# Patient Record
Sex: Female | Born: 1970 | Race: Asian | Hispanic: No | Marital: Married | Smoking: Never smoker
Health system: Southern US, Community
[De-identification: ages and names within clinical notes are randomized; demographics above are authoritative.]

## PROBLEM LIST (undated history)

## (undated) DIAGNOSIS — R1084 Generalized abdominal pain: Secondary | ICD-10-CM

## (undated) DIAGNOSIS — G8929 Other chronic pain: Secondary | ICD-10-CM

## (undated) HISTORY — PX: COLONOSCOPY: SHX174

---

## 2005-10-16 HISTORY — PX: COLONOSCOPY: SHX174

## 2007-10-30 ENCOUNTER — Encounter: Admission: RE | Admit: 2007-10-30 | Discharge: 2007-10-30 | Payer: Self-pay | Admitting: Gastroenterology

## 2007-11-18 ENCOUNTER — Encounter: Admission: RE | Admit: 2007-11-18 | Discharge: 2007-11-18 | Payer: Self-pay | Admitting: Gastroenterology

## 2012-12-02 ENCOUNTER — Ambulatory Visit (INDEPENDENT_AMBULATORY_CARE_PROVIDER_SITE_OTHER): Payer: BC Managed Care – PPO | Admitting: Emergency Medicine

## 2012-12-02 VITALS — BP 92/65 | HR 76 | Temp 99.0°F | Resp 17 | Ht 65.5 in | Wt 122.0 lb

## 2012-12-02 DIAGNOSIS — R1013 Epigastric pain: Secondary | ICD-10-CM

## 2012-12-02 LAB — POCT CBC
Granulocyte percent: 88 %G — AB (ref 37–80)
HCT, POC: 31.4 % — AB (ref 37.7–47.9)
MCH, POC: 27.1 pg (ref 27–31.2)
MCHC: 31.8 g/dL (ref 31.8–35.4)
MCV: 85.2 fL (ref 80–97)
Platelet Count, POC: 183 10*3/uL (ref 142–424)
RDW, POC: 15.3 %
WBC: 10.6 10*3/uL — AB (ref 4.6–10.2)

## 2012-12-02 LAB — POCT URINALYSIS DIPSTICK
Bilirubin, UA: NEGATIVE
Blood, UA: NEGATIVE
Ketones, UA: 160
Spec Grav, UA: >=1.03
pH, UA: 5.5

## 2012-12-02 LAB — POCT UA - MICROSCOPIC ONLY
Bacteria, U Microscopic: NEGATIVE
Casts, Ur, LPF, POC: NEGATIVE
Crystals, Ur, HPF, POC: NEGATIVE
Mucus, UA: NEGATIVE
Yeast, UA: NEGATIVE

## 2012-12-02 NOTE — Progress Notes (Signed)
[facility] [address], [place] [address] [phone]  Date:  12/02/2012   Name:  Kathryn Nunez   DOB:  12-30-1970   MRN:  [id number]  PCP:  No primary provider on file.    Chief Complaint: Abdominal Pain   History of Present Illness:  Kathryn Nunez is a 42 y.o. very pleasant female patient who presents with the following:  Sudden onset of pain in epigastrium after eating a spicy soup for lunch.  The pain migrated into her suprapubic region around 1700 and is more intense.  She denies possibility of pregnancy.  Denies prior pain.  Is nauseated but not vomiting.  No stool change or GU or GYN symptoms.  No fever or chills.  Pain is quite acute and constant.  There is no problem list on file for this patient.   History reviewed. No pertinent past medical history.  Past Surgical History  Procedure Laterality Date  . Cesarean section      History  Substance Use Topics  . Smoking status: Never Smoker   . Smokeless tobacco: Not on file  . Alcohol Use: No    History reviewed. No pertinent family history.  No Known Allergies  Medication list has been reviewed and updated.  No current outpatient prescriptions on file prior to visit.   No current facility-administered medications on file prior to visit.    Review of Systems:  As per HPI, otherwise negative.    Physical Examination: Filed Vitals:   12/02/12 1959  BP: 92/65  Pulse: 76  Temp: 99 F (37.2 C)  Resp: 17   Filed Vitals:   12/02/12 1959  Height: 5' 5.5" (1.664 m)  Weight: 122 lb (55.339 kg)   Body mass index is 19.99 kg/(m^2). Ideal Body Weight: Weight in (lb) to have BMI = 25: 152.2  GEN: WDWN, moderate distress, restless, Non-toxic, A & O x 3 HEENT: Atraumatic, Normocephalic. Neck supple. No masses, No LAD. Ears and Nose: No external deformity. CV: RRR, No M/G/R. No JVD. No thrill. No extra heart sounds. PULM: CTA B, no wheezes, crackles, rhonchi. No retractions. No resp.  distress. No accessory muscle use. ABD: S, NT, ND, +BS. No rebound. No HSM. EXTR: No c/c/e NEURO Normal gait.  PSYCH: Normally interactive. Conversant. Not depressed or anxious appearing.  Calm demeanor.    Assessment and Plan: Abdominal pain To ER for further evaluation  Carmelina Dane, MD  Results for orders placed in visit on 12/02/12  POCT CBC      Result Value Range   WBC 10.6 (*) 4.6 - 10.2 K/uL   Lymph, poc 1.0  0.6 - 3.4   POC LYMPH PERCENT 9.2 (*) 10 - 50 %L   MID (cbc) 0.3  0 - 0.9   POC MID % 2.8  0 - 12 %M   POC Granulocyte 9.3 (*) 2 - 6.9   Granulocyte percent 88.0 (*) 37 - 80 %G   RBC 3.69 (*) 4.04 - 5.48 M/uL   Hemoglobin 10.0 (*) 12.2 - 16.2 g/dL   HCT, POC 21.3 (*) 08.6 - 47.9 %   MCV 85.2  80 - 97 fL   MCH, POC 27.1  27 - 31.2 pg   MCHC 31.8  31.8 - 35.4 g/dL   RDW, POC 57.8     Platelet Count, POC 183  142 - 424 K/uL   MPV 10.9  0 - 99.8 fL  POCT URINE PREGNANCY      Result  Value Range   Preg Test, Ur Negative    POCT URINALYSIS DIPSTICK      Result Value Range   Color, UA yellow     Clarity, UA clear     Glucose, UA neg     Bilirubin, UA neg     Ketones, UA 160     Spec Grav, UA >=1.030     Blood, UA neg     pH, UA 5.5     Protein, UA 30mg /dl     Urobilinogen, UA 0.2     Nitrite, UA neg     Leukocytes, UA Negative    POCT UA - MICROSCOPIC ONLY      Result Value Range   WBC, Ur, HPF, POC 0-3     RBC, urine, microscopic neg     Bacteria, U Microscopic neg     Mucus, UA neg     Epithelial cells, urine per micros 8-12     Crystals, Ur, HPF, POC neg     Casts, Ur, LPF, POC neg     Yeast, UA neg

## 2012-12-03 ENCOUNTER — Encounter (HOSPITAL_COMMUNITY)
Admission: EM | Disposition: A | Discharge status: Home / Self Care | Payer: Self-pay | Source: Home / Self Care | Attending: Emergency Medicine

## 2012-12-03 ENCOUNTER — Encounter (HOSPITAL_COMMUNITY): Payer: Self-pay | Admitting: Certified Registered"

## 2012-12-03 ENCOUNTER — Encounter (HOSPITAL_COMMUNITY): Payer: Self-pay | Admitting: Registered Nurse

## 2012-12-03 ENCOUNTER — Ambulatory Visit (HOSPITAL_COMMUNITY)
Admission: EM | Admit: 2012-12-03 | Discharge: 2012-12-04 | Disposition: A | Discharge status: Home / Self Care | Payer: BC Managed Care – PPO | Attending: Surgery | Admitting: Surgery

## 2012-12-03 ENCOUNTER — Encounter (HOSPITAL_COMMUNITY): Payer: Self-pay | Admitting: *Deleted

## 2012-12-03 ENCOUNTER — Emergency Department (HOSPITAL_COMMUNITY): Payer: BC Managed Care – PPO | Admitting: Registered Nurse

## 2012-12-03 ENCOUNTER — Emergency Department (HOSPITAL_COMMUNITY): Payer: BC Managed Care – PPO

## 2012-12-03 DIAGNOSIS — Z8371 Family history of colonic polyps: Secondary | ICD-10-CM

## 2012-12-03 DIAGNOSIS — Z83719 Family history of colon polyps, unspecified: Secondary | ICD-10-CM

## 2012-12-03 DIAGNOSIS — G8929 Other chronic pain: Secondary | ICD-10-CM | POA: Diagnosis present

## 2012-12-03 DIAGNOSIS — K561 Intussusception: Secondary | ICD-10-CM | POA: Insufficient documentation

## 2012-12-03 DIAGNOSIS — N854 Malposition of uterus: Secondary | ICD-10-CM | POA: Insufficient documentation

## 2012-12-03 DIAGNOSIS — K3580 Unspecified acute appendicitis: Secondary | ICD-10-CM

## 2012-12-03 DIAGNOSIS — R1084 Generalized abdominal pain: Secondary | ICD-10-CM | POA: Diagnosis present

## 2012-12-03 DIAGNOSIS — R031 Nonspecific low blood-pressure reading: Secondary | ICD-10-CM | POA: Insufficient documentation

## 2012-12-03 HISTORY — DX: Other chronic pain: G89.29

## 2012-12-03 HISTORY — PX: LAPAROSCOPIC APPENDECTOMY: SHX408

## 2012-12-03 HISTORY — DX: Generalized abdominal pain: R10.84

## 2012-12-03 LAB — COMPREHENSIVE METABOLIC PANEL
AST: 20 U/L (ref 0–37)
Albumin: 3.7 g/dL (ref 3.5–5.2)
BUN: 9 mg/dL (ref 6–23)
Chloride: 97 mEq/L (ref 96–112)
Creatinine, Ser: 0.57 mg/dL (ref 0.50–1.10)
Potassium: 3.7 mEq/L (ref 3.5–5.1)
Total Bilirubin: 0.4 mg/dL (ref 0.3–1.2)
Total Protein: 7.6 g/dL (ref 6.0–8.3)

## 2012-12-03 LAB — CBC WITH DIFFERENTIAL/PLATELET
Basophils Absolute: 0 10*3/uL (ref 0.0–0.1)
Basophils Relative: 0 % (ref 0–1)
Eosinophils Absolute: 0 10*3/uL (ref 0.0–0.7)
MCH: 27.9 pg (ref 26.0–34.0)
MCHC: 33.4 g/dL (ref 30.0–36.0)
Monocytes Absolute: 0.7 10*3/uL (ref 0.1–1.0)
Monocytes Relative: 4 % (ref 3–12)
Neutro Abs: 15.1 10*3/uL — ABNORMAL HIGH (ref 1.7–7.7)
Neutrophils Relative %: 90 % — ABNORMAL HIGH (ref 43–77)
RDW: 14.1 % (ref 11.5–15.5)

## 2012-12-03 LAB — URINALYSIS, ROUTINE W REFLEX MICROSCOPIC
Glucose, UA: NEGATIVE mg/dL
Hgb urine dipstick: NEGATIVE
Leukocytes, UA: NEGATIVE
pH: 5.5 (ref 5.0–8.0)

## 2012-12-03 SURGERY — APPENDECTOMY, LAPAROSCOPIC
Anesthesia: General | Wound class: Clean Contaminated

## 2012-12-03 SURGERY — APPENDECTOMY, LAPAROSCOPIC
Anesthesia: General

## 2012-12-03 MED ORDER — METOCLOPRAMIDE HCL 5 MG/ML IJ SOLN: 10.0000 mg | Freq: Once | INTRAMUSCULAR | Status: DC | PRN

## 2012-12-03 MED ORDER — NAPROXEN 500 MG PO TABS
500.0000 mg | ORAL_TABLET | Freq: Two times a day (BID) | ORAL | Status: DC | PRN
  Filled 2012-12-03: qty 1

## 2012-12-03 MED ORDER — ROCURONIUM BROMIDE 100 MG/10ML IV SOLN
INTRAVENOUS | Status: DC | PRN
  Administered 2012-12-03: 35 mg via INTRAVENOUS

## 2012-12-03 MED ORDER — MAGIC MOUTHWASH
15.0000 mL | Freq: Four times a day (QID) | ORAL | Status: DC | PRN
  Filled 2012-12-03: qty 15

## 2012-12-03 MED ORDER — MIDAZOLAM HCL 5 MG/5ML IJ SOLN
INTRAMUSCULAR | Status: DC | PRN
  Administered 2012-12-03: 1 mg via INTRAVENOUS

## 2012-12-03 MED ORDER — AMOXICILLIN-POT CLAVULANATE 875-125 MG PO TABS: 1.0000 | ORAL_TABLET | Freq: Two times a day (BID) | ORAL | Status: AC

## 2012-12-03 MED ORDER — OXYCODONE HCL 5 MG PO TABS: 5.0000 mg | ORAL_TABLET | Freq: Once | ORAL | Status: DC | PRN

## 2012-12-03 MED ORDER — GLYCOPYRROLATE 0.2 MG/ML IJ SOLN
INTRAMUSCULAR | Status: DC | PRN
  Administered 2012-12-03: 0.6 mg via INTRAVENOUS

## 2012-12-03 MED ORDER — LACTATED RINGERS IV BOLUS (SEPSIS): 1000.0000 mL | Freq: Three times a day (TID) | INTRAVENOUS | Status: DC | PRN

## 2012-12-03 MED ORDER — HYDROMORPHONE HCL PF 1 MG/ML IJ SOLN
0.5000 mg | INTRAMUSCULAR | Status: DC | PRN
  Administered 2012-12-03: 1 mg via INTRAVENOUS
  Filled 2012-12-03: qty 1

## 2012-12-03 MED ORDER — SUCCINYLCHOLINE CHLORIDE 20 MG/ML IJ SOLN
INTRAMUSCULAR | Status: DC | PRN
  Administered 2012-12-03: 100 mg via INTRAVENOUS

## 2012-12-03 MED ORDER — SODIUM CHLORIDE 0.9 % IV SOLN
INTRAVENOUS | Status: DC
  Administered 2012-12-03 – 2012-12-04 (×5): via INTRAVENOUS

## 2012-12-03 MED ORDER — ONDANSETRON HCL 4 MG/2ML IJ SOLN
INTRAMUSCULAR | Status: DC | PRN
  Administered 2012-12-03: 4 mg via INTRAVENOUS

## 2012-12-03 MED ORDER — IOHEXOL 300 MG/ML  SOLN
100.0000 mL | Freq: Once | INTRAMUSCULAR | Status: AC | PRN
  Administered 2012-12-03: 100 mL via INTRAVENOUS

## 2012-12-03 MED ORDER — ACETAMINOPHEN 325 MG PO TABS: 650.0000 mg | ORAL_TABLET | Freq: Four times a day (QID) | ORAL | Status: AC | PRN

## 2012-12-03 MED ORDER — ACETAMINOPHEN 650 MG RE SUPP: 650.0000 mg | Freq: Four times a day (QID) | RECTAL | Status: DC | PRN

## 2012-12-03 MED ORDER — IOHEXOL 300 MG/ML  SOLN
50.0000 mL | Freq: Once | INTRAMUSCULAR | Status: AC | PRN
  Administered 2012-12-03: 50 mL via ORAL

## 2012-12-03 MED ORDER — ONDANSETRON HCL 4 MG/2ML IJ SOLN: 4.0000 mg | Freq: Four times a day (QID) | INTRAMUSCULAR | Status: DC | PRN

## 2012-12-03 MED ORDER — LACTATED RINGERS IV SOLN
INTRAVENOUS | Status: DC | PRN
  Administered 2012-12-03 (×2): via INTRAVENOUS

## 2012-12-03 MED ORDER — ALUM & MAG HYDROXIDE-SIMETH 200-200-20 MG/5ML PO SUSP: 30.0000 mL | Freq: Four times a day (QID) | ORAL | Status: DC | PRN

## 2012-12-03 MED ORDER — PROPOFOL 10 MG/ML IV BOLUS
INTRAVENOUS | Status: DC | PRN
  Administered 2012-12-03: 150 mg via INTRAVENOUS

## 2012-12-03 MED ORDER — OXYCODONE HCL 5 MG PO TABS: 5.0000 mg | ORAL_TABLET | ORAL | Status: AC | PRN

## 2012-12-03 MED ORDER — SODIUM CHLORIDE 0.9 % IV SOLN
Freq: Once | INTRAVENOUS | Status: AC
  Administered 2012-12-03: 03:00:00 via INTRAVENOUS

## 2012-12-03 MED ORDER — DEXTROSE 5 % IV SOLN
2.0000 g | INTRAVENOUS | Status: DC | PRN
  Administered 2012-12-03: 2 g via INTRAVENOUS

## 2012-12-03 MED ORDER — LABETALOL HCL 5 MG/ML IV SOLN
INTRAVENOUS | Status: DC | PRN
  Administered 2012-12-03: 5 mg via INTRAVENOUS

## 2012-12-03 MED ORDER — OXYCODONE HCL 5 MG/5ML PO SOLN
5.0000 mg | Freq: Once | ORAL | Status: DC | PRN
  Filled 2012-12-03: qty 5

## 2012-12-03 MED ORDER — DEXTROSE 5 % IV SOLN
2.0000 g | INTRAVENOUS | Status: AC
  Filled 2012-12-03 (×2): qty 2

## 2012-12-03 MED ORDER — SODIUM CHLORIDE 0.9 % IV BOLUS (SEPSIS): 1000.0000 mL | Freq: Once | INTRAVENOUS | Status: DC

## 2012-12-03 MED ORDER — CEFOXITIN SODIUM-DEXTROSE 1-4 GM-% IV SOLR (PREMIX)
INTRAVENOUS | Status: AC
  Filled 2012-12-03: qty 100

## 2012-12-03 MED ORDER — ONDANSETRON 8 MG/NS 50 ML IVPB
8.0000 mg | Freq: Four times a day (QID) | INTRAVENOUS | Status: DC | PRN
  Filled 2012-12-03: qty 8

## 2012-12-03 MED ORDER — DIPHENHYDRAMINE HCL 50 MG/ML IJ SOLN: 12.5000 mg | Freq: Four times a day (QID) | INTRAMUSCULAR | Status: DC | PRN

## 2012-12-03 MED ORDER — BUPIVACAINE-EPINEPHRINE 0.25% -1:200000 IJ SOLN
INTRAMUSCULAR | Status: DC | PRN
  Administered 2012-12-03: 30 mL

## 2012-12-03 MED ORDER — HYDROMORPHONE HCL PF 1 MG/ML IJ SOLN: 0.2500 mg | INTRAMUSCULAR | Status: DC | PRN

## 2012-12-03 MED ORDER — OXYCODONE HCL 5 MG PO TABS
5.0000 mg | ORAL_TABLET | ORAL | Status: DC | PRN
Start: 2012-12-03 — End: 2012-12-04
  Administered 2012-12-03 – 2012-12-04 (×3): 5 mg via ORAL
  Filled 2012-12-03 (×3): qty 1

## 2012-12-03 MED ORDER — HEPARIN SODIUM (PORCINE) 5000 UNIT/ML IJ SOLN
5000.0000 [IU] | Freq: Three times a day (TID) | INTRAMUSCULAR | Status: DC
  Administered 2012-12-04: 5000 [IU] via SUBCUTANEOUS
  Filled 2012-12-03 (×4): qty 1

## 2012-12-03 MED ORDER — LACTATED RINGERS IV BOLUS (SEPSIS)
800.0000 mL | Freq: Once | INTRAVENOUS | Status: AC
  Administered 2012-12-03: 800 mL via INTRAVENOUS

## 2012-12-03 MED ORDER — BUPIVACAINE-EPINEPHRINE PF 0.25-1:200000 % IJ SOLN
INTRAMUSCULAR | Status: AC
  Filled 2012-12-03: qty 30

## 2012-12-03 MED ORDER — DEXAMETHASONE SODIUM PHOSPHATE 10 MG/ML IJ SOLN
INTRAMUSCULAR | Status: DC | PRN
  Administered 2012-12-03: 10 mg via INTRAVENOUS

## 2012-12-03 MED ORDER — ACETAMINOPHEN 325 MG PO TABS: 325.0000 mg | ORAL_TABLET | Freq: Four times a day (QID) | ORAL | Status: DC | PRN

## 2012-12-03 MED ORDER — ONDANSETRON HCL 4 MG/2ML IJ SOLN
4.0000 mg | Freq: Four times a day (QID) | INTRAMUSCULAR | Status: DC | PRN
  Administered 2012-12-03: 4 mg via INTRAVENOUS
  Filled 2012-12-03 (×2): qty 2

## 2012-12-03 MED ORDER — LIP MEDEX EX OINT
1.0000 "application " | TOPICAL_OINTMENT | Freq: Two times a day (BID) | CUTANEOUS | Status: DC
  Administered 2012-12-03 – 2012-12-04 (×2): 1 via TOPICAL
  Filled 2012-12-03 (×2): qty 7

## 2012-12-03 MED ORDER — NEOSTIGMINE METHYLSULFATE 1 MG/ML IJ SOLN
INTRAMUSCULAR | Status: DC | PRN
  Administered 2012-12-03: 4 mg via INTRAVENOUS

## 2012-12-03 MED ORDER — DEXTROSE 5 % IV SOLN
2.0000 g | Freq: Four times a day (QID) | INTRAVENOUS | Status: DC
  Administered 2012-12-03 – 2012-12-04 (×5): 2 g via INTRAVENOUS
  Filled 2012-12-03 (×7): qty 2

## 2012-12-03 MED ORDER — FENTANYL CITRATE 0.05 MG/ML IJ SOLN
INTRAMUSCULAR | Status: DC | PRN
  Administered 2012-12-03: 100 ug via INTRAVENOUS
  Administered 2012-12-03: 50 ug via INTRAVENOUS
  Administered 2012-12-03 (×2): 100 ug via INTRAVENOUS

## 2012-12-03 MED ORDER — BISACODYL 10 MG RE SUPP: 10.0000 mg | Freq: Two times a day (BID) | RECTAL | Status: DC | PRN

## 2012-12-03 MED ORDER — ONDANSETRON HCL 4 MG/2ML IJ SOLN
4.0000 mg | Freq: Once | INTRAMUSCULAR | Status: AC
  Administered 2012-12-03: 4 mg via INTRAVENOUS
  Filled 2012-12-03: qty 2

## 2012-12-03 MED ORDER — KETOROLAC TROMETHAMINE 30 MG/ML IJ SOLN
INTRAMUSCULAR | Status: DC | PRN
  Administered 2012-12-03: 30 mg via INTRAVENOUS

## 2012-12-03 MED ORDER — SODIUM CHLORIDE 0.9 % IV BOLUS (SEPSIS)
500.0000 mL | Freq: Once | INTRAVENOUS | Status: AC
  Administered 2012-12-03: 500 mL via INTRAVENOUS

## 2012-12-03 MED ORDER — METOPROLOL TARTRATE 1 MG/ML IV SOLN
5.0000 mg | Freq: Four times a day (QID) | INTRAVENOUS | Status: DC | PRN
  Filled 2012-12-03: qty 5

## 2012-12-03 MED ORDER — PROMETHAZINE HCL 25 MG/ML IJ SOLN
12.5000 mg | Freq: Four times a day (QID) | INTRAMUSCULAR | Status: DC | PRN
  Administered 2012-12-03: 25 mg via INTRAVENOUS
  Filled 2012-12-03 (×2): qty 1

## 2012-12-03 MED ORDER — FENTANYL CITRATE 0.05 MG/ML IJ SOLN
50.0000 ug | Freq: Once | INTRAMUSCULAR | Status: AC
  Administered 2012-12-03: 50 ug via INTRAVENOUS
  Filled 2012-12-03: qty 2

## 2012-12-03 MED ORDER — ACETAMINOPHEN 10 MG/ML IV SOLN
INTRAVENOUS | Status: DC | PRN
  Administered 2012-12-03: 1000 mg via INTRAVENOUS

## 2012-12-03 MED ORDER — MAGNESIUM HYDROXIDE 400 MG/5ML PO SUSP: 30.0000 mL | Freq: Two times a day (BID) | ORAL | Status: DC | PRN

## 2012-12-03 MED ORDER — ACETAMINOPHEN 10 MG/ML IV SOLN
INTRAVENOUS | Status: AC
  Filled 2012-12-03: qty 100

## 2012-12-03 SURGICAL SUPPLY — 38 items
APPLIER CLIP 5 13 M/L LIGAMAX5 (MISCELLANEOUS)
APPLIER CLIP ROT 10 11.4 M/L (STAPLE)
CANISTER SUCTION 2500CC (MISCELLANEOUS) ×2 IMPLANT
CLIP APPLIE 5 13 M/L LIGAMAX5 (MISCELLANEOUS) IMPLANT
CLIP APPLIE ROT 10 11.4 M/L (STAPLE) IMPLANT
CLOTH BEACON ORANGE TIMEOUT ST (SAFETY) ×2 IMPLANT
CUTTER FLEX LINEAR 45M (STAPLE) IMPLANT
DECANTER SPIKE VIAL GLASS SM (MISCELLANEOUS) ×2 IMPLANT
DRAPE LAPAROSCOPIC ABDOMINAL (DRAPES) ×2 IMPLANT
DRAPE UTILITY XL STRL (DRAPES) ×2 IMPLANT
DRSG TEGADERM 2-3/8X2-3/4 SM (GAUZE/BANDAGES/DRESSINGS) ×4 IMPLANT
DRSG TEGADERM 4X4.75 (GAUZE/BANDAGES/DRESSINGS) ×2 IMPLANT
ELECT REM PT RETURN 9FT ADLT (ELECTROSURGICAL) ×2
ELECTRODE REM PT RTRN 9FT ADLT (ELECTROSURGICAL) ×1 IMPLANT
ENDOLOOP SUT PDS II  0 18 (SUTURE)
ENDOLOOP SUT PDS II 0 18 (SUTURE) IMPLANT
GLOVE BIOGEL PI IND STRL 7.0 (GLOVE) ×1 IMPLANT
GLOVE BIOGEL PI INDICATOR 7.0 (GLOVE) ×1
GLOVE ECLIPSE 8.0 STRL XLNG CF (GLOVE) ×2 IMPLANT
GLOVE INDICATOR 8.0 STRL GRN (GLOVE) ×4 IMPLANT
GOWN STRL NON-REIN LRG LVL3 (GOWN DISPOSABLE) ×2 IMPLANT
GOWN STRL REIN XL XLG (GOWN DISPOSABLE) ×4 IMPLANT
KIT BASIN OR (CUSTOM PROCEDURE TRAY) ×2 IMPLANT
NS IRRIG 1000ML POUR BTL (IV SOLUTION) ×2 IMPLANT
PENCIL BUTTON HOLSTER BLD 10FT (ELECTRODE) ×2 IMPLANT
POUCH SPECIMEN RETRIEVAL 10MM (ENDOMECHANICALS) IMPLANT
RELOAD 45 VASCULAR/THIN (ENDOMECHANICALS) IMPLANT
RELOAD STAPLE TA45 3.5 REG BLU (ENDOMECHANICALS) IMPLANT
SET IRRIG TUBING LAPAROSCOPIC (IRRIGATION / IRRIGATOR) ×2 IMPLANT
SOLUTION ANTI FOG 6CC (MISCELLANEOUS) ×2 IMPLANT
SUT MNCRL AB 4-0 PS2 18 (SUTURE) ×2 IMPLANT
TOWEL OR 17X26 10 PK STRL BLUE (TOWEL DISPOSABLE) ×2 IMPLANT
TRAY FOLEY CATH 14FRSI W/METER (CATHETERS) ×2 IMPLANT
TRAY LAP CHOLE (CUSTOM PROCEDURE TRAY) ×2 IMPLANT
TROCAR BLADELESS OPT 5 100 (ENDOMECHANICALS) ×2 IMPLANT
TROCAR XCEL 12X100 BLDLESS (ENDOMECHANICALS) ×2 IMPLANT
TROCAR XCEL BLADELESS 5X75MML (TROCAR) ×2 IMPLANT
TUBING INSUFFLATION 10FT LAP (TUBING) ×2 IMPLANT

## 2012-12-03 NOTE — Anesthesia Preprocedure Evaluation (Signed)
Anesthesia Evaluation  Patient identified by MRN, date of birth, ID band Patient awake    Reviewed: Allergy & Precautions, H&P , NPO status , Patient's Chart, lab work & pertinent test results, reviewed documented beta blocker date and time   Airway Mallampati: II TM Distance: >3 FB Neck ROM: full    Dental   Pulmonary neg pulmonary ROS,  breath sounds clear to auscultation        Cardiovascular negative cardio ROS  Rhythm:regular     Neuro/Psych negative neurological ROS  negative psych ROS   GI/Hepatic negative GI ROS, Neg liver ROS,   Endo/Other  negative endocrine ROS  Renal/GU negative Renal ROS  negative genitourinary   Musculoskeletal   Abdominal   Peds  Hematology negative hematology ROS (+)   Anesthesia Other Findings See surgeon's H&P   Reproductive/Obstetrics negative OB ROS                           Anesthesia Physical Anesthesia Plan  ASA: I and emergent  Anesthesia Plan: General   Post-op Pain Management:    Induction: Intravenous, Rapid sequence and Cricoid pressure planned  Airway Management Planned: Oral ETT  Additional Equipment:   Intra-op Plan:   Post-operative Plan: Extubation in OR  Informed Consent: I have reviewed the patients History and Physical, chart, labs and discussed the procedure including the risks, benefits and alternatives for the proposed anesthesia with the patient or authorized representative who has indicated his/her understanding and acceptance.   Dental Advisory Given  Plan Discussed with: CRNA and Surgeon  Anesthesia Plan Comments:         Anesthesia Quick Evaluation  

## 2012-12-03 NOTE — Progress Notes (Signed)
Dr. Gelene Mink in- made aware of patient's blood pressures- O.K. To go to floor

## 2012-12-03 NOTE — ED Provider Notes (Signed)
History     CSN: [id number]  Arrival date & time 12/03/12  0236   First MD Initiated Contact with Patient 12/03/12 0247      Chief Complaint  Patient presents with  . Abdominal Pain    (Consider location/radiation/quality/duration/timing/severity/associated sxs/prior treatment) HPI 42 year old female presents to emergency department complaining of diffuse abdominal pain. Patient was seen in urgent care earlier today after onset of upper abdominal pain described as a burning sensation after having soup at work.She had lab work done there, andreports her white blood cell count was slightly elevated. Patient denies history of diverticulitis. She has had colonoscopy due to left-sided abdominal pain in the past. She reports she had polyps. She is nauseated but no vomiting. Pain has moved from her epigastrium down throughout her abdomen. She denies any urinary symptoms. Last menstrual period was earlier this month. She's had a C-section, but no other abdominal surgeries. No fever, diarrhea or vomiting. No recent travel, no unusual foods, no sick contacts.  Pain not improving with time.  History reviewed. No pertinent past medical history.  Past Surgical History  Procedure Laterality Date  . Cesarean section      History reviewed. No pertinent family history.  History  Substance Use Topics  . Smoking status: Never Smoker   . Smokeless tobacco: Not on file  . Alcohol Use: No    OB History   Grav Para Term Preterm Abortions TAB SAB Ect Mult Living                  Review of Systems  All other systems reviewed and are negative.    Allergies  Review of patient's allergies indicates no known allergies.  Home Medications   Current Outpatient Rx  Name  Route  Sig  Dispense  Refill  . Green Tea, Camillia sinensis, (GREEN TEA PO)   Oral   Take 2 capsules by mouth.           BP 115/78  Pulse 87  Temp(Src) 98 F (36.7 C) (Oral)  Resp 20  SpO2 99%  LMP  11/21/2012  Physical Exam  Nursing note reviewed. Constitutional: She is oriented to person, place, and time. She appears well-developed and well-nourished.  HENT:  Head: Normocephalic and atraumatic.  Nose: Nose normal.  Mouth/Throat: Oropharynx is clear and moist.  Eyes: Conjunctivae and EOM are normal. Pupils are equal, round, and reactive to light.  Neck: Normal range of motion. Neck supple. No JVD present. No tracheal deviation present. No thyromegaly present.  Cardiovascular: Normal rate, regular rhythm, normal heart sounds and intact distal pulses.  Exam reveals no gallop and no friction rub.   No murmur heard. Pulmonary/Chest: Effort normal and breath sounds normal. No stridor. No respiratory distress. She has no wheezes. She has no rales. She exhibits no tenderness.  Abdominal: Soft. Bowel sounds are normal. She exhibits no distension and no mass. There is tenderness (diffuse tenderness, slightly worse in LLQ). There is no rebound and no guarding.  Musculoskeletal: Normal range of motion. She exhibits no edema and no tenderness.  Lymphadenopathy:    She has no cervical adenopathy.  Neurological: She is alert and oriented to person, place, and time. No cranial nerve deficit. She exhibits normal muscle tone. Coordination normal.  Skin: Skin is warm and dry. No rash noted. No erythema. No pallor.  Psychiatric: She has a normal mood and affect. Her behavior is normal. Judgment and thought content normal.    ED Course  Procedures (including critical care  time)  Labs Reviewed  CBC WITH DIFFERENTIAL - Abnormal; Notable for the following:    WBC 16.9 (*)    HCT 35.9 (*)    Neutrophils Relative 90 (*)    Neutro Abs 15.1 (*)    Lymphocytes Relative 6 (*)    All other components within normal limits  COMPREHENSIVE METABOLIC PANEL - Abnormal; Notable for the following:    Sodium 132 (*)    Glucose, Bld 111 (*)    All other components within normal limits  URINALYSIS, ROUTINE W  REFLEX MICROSCOPIC - Abnormal; Notable for the following:    Ketones, ur >80 (*)    All other components within normal limits   Ct Abdomen Pelvis W Contrast  12/03/2012  *RADIOLOGY REPORT*  Clinical Data: Epigastric pain  CT ABDOMEN AND PELVIS WITH CONTRAST  Technique:  Multidetector CT imaging of the abdomen and pelvis was performed following the standard protocol during bolus administration of intravenous contrast.  Contrast: OMNIPAQUE IOHEXOL 300 MG/ML  SOLN  Comparison: 10/30/2007  Findings: Lung bases are clear.  Unremarkable liver, biliary system, spleen, pancreas, adrenal glands.  Symmetric renal enhancement.  No hydronephrosis or hydroureter.  No CT evidence for colitis. Limited colonic evaluation due to decompressed state.  The appendix is distended up to 1 cm.  An appendicolith is present.  Mild periappendiceal fat stranding about the tip.  An intussusception of small bowel is noted in the left hemiabdomen (series 2 image 35), likely transient/incidental. No bowel obstruction.  No free intraperitoneal air.  No lymphadenopathy.  Normal caliber aorta and branch vessels.  Bladder within normal limits.  Trace free fluid within the pelvis.  Retroverted uterus with suggestion of intramural fibroids.  No acute osseous finding.  IMPRESSION: Distended appendix up to 1 cm with mild periappendiceal fat stranding.  There is an appendicolith at the base.  These findings are suspicious for acute appendicitis.   Original Report Authenticated By: Jearld Lesch, M.D.      1. Appendicitis, acute       MDM  42 year old female with worsening abdominal pain over the last 12-14 hours. Patient had CBC done at urgent care which showed white count 10.6, currently 16.9 with left shift. Concern for possible diverticulitis. We'll get CT abdomen pelvis.        Olivia Mackie, MD 12/03/12 [phone]

## 2012-12-03 NOTE — Progress Notes (Signed)
Dr. Gelene Mink made aware of patient's blood pressures- orders given- patient to receive a total of 800 cc LR IVB in PACU

## 2012-12-03 NOTE — Anesthesia Postprocedure Evaluation (Signed)
Anesthesia Post Note  Patient: Kathryn Nunez  Procedure(s) Performed: Procedure(s) (LRB): APPENDECTOMY LAPAROSCOPIC (N/A)  Anesthesia type: General  Patient location: PACU  Post pain: Pain level controlled  Post assessment: Patient's Cardiovascular Status Stable  Last Vitals:  Filed Vitals:   12/03/12 0815  BP: 97/47  Pulse: 58  Temp:   Resp: 16    Post vital signs: Reviewed and stable  Level of consciousness: alert  Complications: No apparent anesthesia complications

## 2012-12-03 NOTE — ED Notes (Signed)
Pt in from urgent care c/o generalized abd pain, also nausea but denies vomiting, symptoms started earlier today

## 2012-12-03 NOTE — Progress Notes (Signed)
Dr. Gelene Mink in- made aware of patient's blood pressures

## 2012-12-03 NOTE — Transfer of Care (Signed)
Immediate Anesthesia Transfer of Care Note  Patient: Kathryn Nunez  Procedure(s) Performed: Procedure(s): APPENDECTOMY LAPAROSCOPIC (N/A)  Patient Location: PACU  Anesthesia Type:General  Level of Consciousness: awake, alert , oriented and patient cooperative  Airway & Oxygen Therapy: Patient Spontanous Breathing and Patient connected to face mask oxygen  Post-op Assessment: Report given to PACU RN, Post -op Vital signs reviewed and stable and Patient moving all extremities X 4  Post vital signs: stable  Complications: No apparent anesthesia complications

## 2012-12-03 NOTE — Op Note (Addendum)
7:54 AM  PATIENT:  Kathryn Nunez  42 y.o. female  Patient has no care team.  PRE-OPERATIVE DIAGNOSIS:  appendicitis  POST-OPERATIVE DIAGNOSIS: Acute suppurative appendicitis  PROCEDURES:   Diagnostic laparoscopy Laparoscopic appendectomy  SURGEON:  Surgeon(s): Ardeth Sportsman, MD  ASSISTANT: RN   ANESTHESIA:   local and general  EBL:  Total I/O In: 1000 [I.V.:1000] Out: -   Delay start of Pharmacological VTE agent (>24hrs) due to surgical blood loss or risk of bleeding:  no  DRAINS: none   SPECIMEN:  Source of Specimen:  Appendix  DISPOSITION OF SPECIMEN:  PATHOLOGY  COUNTS:  YES  PLAN OF CARE: Admit for overnight observation  PATIENT DISPOSITION:  PACU - hemodynamically stable.   INDICATIONS: Patient with concerning symptoms & work up suspicious for appendicitis.  Surgery was recommended:  The anatomy & physiology of the digestive tract was discussed.  The pathophysiology of appendicitis was discussed.  Natural history risks without surgery was discussed.   I feel the risks of no intervention will lead to serious problems that outweigh the operative risks; therefore, I recommended diagnostic laparoscopy with removal of appendix to remove the pathology.  Laparoscopic & open techniques were discussed.   I noted a good likelihood this will help address the problem.    Risks such as bleeding, infection, abscess, leak, reoperation, possible ostomy, hernia, heart attack, death, and other risks were discussed.  Goals of post-operative recovery were discussed as well.  We will work to minimize complications.  Questions were answered.  The patient expresses understanding & wishes to proceed with surgery.  OR FINDINGS: Inflamed anterior appendix with phlegmon.  Mild separation.  Mild focal peritonitis.  No evidence of intra-abdominal adhesions.  No Meckel's diverticulum.  Ovary uterus normal.:  Normal.  Mild adhesions to gallbladder gallbladder otherwise normal.  Liver  normal.  DESCRIPTION:   The patient was identified & brought into the operating room. The patient was positioned supine with arms tucked. SCDs were active during the entire case. The patient underwent general anesthesia without any difficulty.  The abdomen was prepped and draped in a sterile fashion. A Surgical Timeout confirmed our plan.  I made a transverse incision through the superior umbilical fold.  I made a small transverse nick through the infraumbilical fascia and confirmed peritoneal entry.  I placed a 5mm port.  We induced carbon dioxide insufflation.  Camera inspection revealed no injury.  I placed additional ports under direct laparoscopic visualization.  I mobilized the terminal ileum to proximal ascending colon in a lateral to medial fashion.  I took care to avoid injuring any retroperitoneal structures.  I freed the appendix off its attachments to the ascending colon and cecal mesentery.  I elevated the appendix. I skeletonized the mesoappendix. I was able to free off the base of the appendix which was still viable.  I stapled the appendix off the cecum using a laparoscopic stapler. I took a healthy cuff of viable cecum. I ligated the mesoappendix and assured hemostasis in the mesentery.  I placed the appendix inside an EndoCatch bag and removed out the 12 mm port.  I did copious irrigation. Hemostasis was good in the mesoappendix, colon mesentery, and retroperitoneum. Staple line was intact on the cecum with no bleeding. I washed out the pelvis, retrohepatic space and right paracolic gutter. I washed out the left side as well.  Hemostasis is good. There was no perforation or injury.  Because the area cleaned up well after irrigation, I did not place a  drain.  Because of her history of intermittent abdominal pain, I decided to perform a more thorough diagnostic laparoscopy.  I ran the small bowel from the the cecal valve to the ligament of Treitz.  No Meckel's was found.  No intraloop  adhesions.  The colon appeared normal from the cecum to the peritoneal reflection.  Adnexa seem normal.  Uterus had some mild fibroid change to it but nothing severely abnormal.  A few small Adhesions to the gallbladder the right upper quadrant to greater omentum but no evidence of cholecystitis.  Liver looked normal.  Stomach and spleen with normal.  Omentum seemed normal.  I saw no hernias.  This was consistent with a normal diagnostic laparoscopy aside from the appendicitis already stated.  I aspirated the carbon dioxide. I removed the ports. I closed the 12 mm fascia site using a 0 Vicryl stitch. I closed skin using 4-0 monocryl stitch.  Patient was extubated and sent to the recovery room.  I am trying to locate the patient's family to discuss the operative findings. I suspect the patient is going used in the hospital at least this AM and will need antibiotics for 3 days.

## 2012-12-03 NOTE — H&P (Signed)
Kathryn Nunez  11/29/1970 [id number]   This patient is a 42 y.o.female who presents today for surgical evaluation at the request of Dr Norlene Campbell, [facility] ED.   Reason for evaluation: Acute appendicitis  Young female originally from [place] who has some family history of colon polyps.  Underwent colonoscopy when she was younger while she lived in [place].  Initial one showed polyps in 2007.  Came to [place].  She tells me she had a colonoscopy done a few years ago by Dr. Bosie Clos.  That colonoscopy was normal.  I do not have access to records at this time.  Sounds like she has had a history of intermittent chronic abdominal pain that is central.  CT enterography and colonoscopy in 2009 negative.    More recently he started having new pain in her upper abdomen after having soup for lunch.  Burning abdominal pain was mainly central.  However the pain is now lower.  Some nausea but no vomiting yet.  Worse after eating.  She went to urgent care clinic.  Elevated white count noted.  Sent in the emergency room for CAT scan.  CT scan concerning for appendicitis.  Dr. Norlene Campbell requested surgical consultation.  No personal nor family history of GI/colon cancer, inflammatory bowel disease, irritable bowel syndrome, allergy such as Celiac Sprue, dietary/dairy problems, colitis, ulcers nor gastritis.  No recent sick contacts/gastroenteritis.  No travel outside the country.  No changes in diet.    History reviewed. No pertinent past medical history.  Past Surgical History  Procedure Laterality Date  . Cesarean section      History   Social History  . Marital Status: Married    Spouse Name: N/A    Number of Children: N/A  . Years of Education: N/A   Occupational History  . Not on file.   Social History Main Topics  . Smoking status: Never Smoker   . Smokeless tobacco: Not on file  . Alcohol Use: No  . Drug Use: No  . Sexually Active: Yes    Birth Control/ Protection: None   Other Topics Concern  . Not  on file   Social History Narrative  . No narrative on file    History reviewed. No pertinent family history.  Current Facility-Administered Medications  Medication Dose Route Frequency Provider Last Rate Last Dose  . 0.9 %  sodium chloride infusion   Intravenous Continuous Ardeth Sportsman, MD      . acetaminophen (TYLENOL) suppository 650 mg  650 mg Rectal Q6H PRN Ardeth Sportsman, MD      . acetaminophen (TYLENOL) tablet 325-650 mg  325-650 mg Oral Q6H PRN Ardeth Sportsman, MD      . alum & mag hydroxide-simeth (MAALOX/MYLANTA) 200-200-20 MG/5ML suspension 30 mL  30 mL Oral Q6H PRN Ardeth Sportsman, MD      . bisacodyl (DULCOLAX) suppository 10 mg  10 mg Rectal Q12H PRN Ardeth Sportsman, MD      . cefOXitin (MEFOXIN) 2 g in dextrose 5 % 50 mL IVPB  2 g Intravenous On Call to OR Ardeth Sportsman, MD      . diphenhydrAMINE (BENADRYL) injection 12.5-25 mg  12.5-25 mg Intravenous Q6H PRN Ardeth Sportsman, MD      . HYDROmorphone (DILAUDID) injection 0.5-2 mg  0.5-2 mg Intravenous Q30 min PRN Ardeth Sportsman, MD      . lactated ringers bolus 1,000 mL  1,000 mL Intravenous Q8H PRN Ardeth Sportsman, MD      .  lip balm (CARMEX) ointment 1 application  1 application Topical BID Ardeth Sportsman, MD      . magic mouthwash  15 mL Oral QID PRN Ardeth Sportsman, MD      . magnesium hydroxide (MILK OF MAGNESIA) suspension 30 mL  30 mL Oral Q12H PRN Ardeth Sportsman, MD      . metoprolol (LOPRESSOR) injection 5 mg  5 mg Intravenous Q6H PRN Ardeth Sportsman, MD      . naproxen (NAPROSYN) tablet 500 mg  500 mg Oral Q12H PRN Ardeth Sportsman, MD      . ondansetron [facility]) injection 4-8 mg  4-8 mg Intravenous Q6H PRN Ardeth Sportsman, MD      . oxyCODONE (Oxy IR/ROXICODONE) immediate release tablet 5-10 mg  5-10 mg Oral Q4H PRN Ardeth Sportsman, MD      . promethazine (PHENERGAN) injection 12.5-25 mg  12.5-25 mg Intravenous Q6H PRN Ardeth Sportsman, MD      . sodium chloride 0.9 % bolus 1,000 mL  1,000 mL Intravenous  Once Olivia Mackie, MD 1,000 mL/hr at 12/03/12 0409 1,000 mL at 12/03/12 0409   Current Outpatient Prescriptions  Medication Sig Dispense Refill  . Green Tea, Camillia sinensis, (GREEN TEA PO) Take 2 capsules by mouth.         No Known Allergies  ROS: Constitutional:  No fevers, chills, sweats.  Weight stable Eyes:  No vision changes, No discharge HENT:  No sore throats, nasal drainage Lymph: No neck swelling, No bruising easily Pulmonary:  No cough, productive sputum CV: No orthopnea, PND  Patient walks 60 minutes for about 1-2 miles without difficulty.  No exertional chest/neck/shoulder/arm pain. GI:  No personal nor family history of GI/colon cancer but +polyps.  No inflammatory bowel disease, irritable bowel syndrome, allergy such as Celiac Sprue, dietary/dairy problems, colitis, ulcers nor gastritis.  No recent sick contacts/gastroenteritis.  No travel outside the country.  No changes in diet. Renal: No UTIs, No hematuria Genital:  No drainage, bleeding, masses Musculoskeletal: No severe joint pain.  Good ROM major joints Skin:  No sores or lesions.  No rashes Heme/Lymph:  No easy bleeding.  No swollen lymph nodes Neuro: No focal weakness/numbness.  No seizures Psych: No suicidal ideation.  No hallucinations  BP 115/78  Pulse 87  Temp(Src) 98 F (36.7 C) (Oral)  Resp 20  SpO2 99%  LMP 11/21/2012  Physical Exam: General: Pt awake/alert/oriented x4 in no major acute distress Eyes: PERRL, normal EOM. Sclera nonicteric Neuro: CN II-XII intact w/o focal sensory/motor deficits. Lymph: No head/neck/groin lymphadenopathy Psych:  No delerium/psychosis/paranoia HENT: Normocephalic, Mucus membranes moist.  No thrush Neck: Supple, No tracheal deviation Chest: No pain.  Good respiratory excursion. CV:  Pulses intact.  Regular rhythm Abdomen: Soft, Nondistended.  +TTP RLQ at McBurney's point.  Worse w cough, carride, bedshake.  +Rosvig sign.  No incarcerated hernias.  Pfannenstiel  incision well healed. Ext:  SCDs BLE.  No significant edema.  No cyanosis Skin: No petechiae / purpurea.  No major sores Musculoskeletal: No severe joint pain.  Good ROM major joints   Results:   Labs: Results for orders placed during the hospital encounter of 12/03/12 (from the past 48 hour(s))  CBC WITH DIFFERENTIAL     Status: Abnormal   Collection Time    12/03/12  2:49 AM      Result Value Range   WBC 16.9 (*) 4.0 - 10.5 K/uL   RBC 4.30  3.87 - 5.11  MIL/uL   Hemoglobin 12.0  12.0 - 15.0 g/dL   HCT 16.1 (*) 09.6 - 04.5 %   MCV 83.5  78.0 - 100.0 fL   MCH 27.9  26.0 - 34.0 pg   MCHC 33.4  30.0 - 36.0 g/dL   RDW 40.9  81.1 - 91.4 %   Platelets 298  150 - 400 K/uL   Neutrophils Relative 90 (*) 43 - 77 %   Neutro Abs 15.1 (*) 1.7 - 7.7 K/uL   Lymphocytes Relative 6 (*) 12 - 46 %   Lymphs Abs 1.0  0.7 - 4.0 K/uL   Monocytes Relative 4  3 - 12 %   Monocytes Absolute 0.7  0.1 - 1.0 K/uL   Eosinophils Relative 0  0 - 5 %   Eosinophils Absolute 0.0  0.0 - 0.7 K/uL   Basophils Relative 0  0 - 1 %   Basophils Absolute 0.0  0.0 - 0.1 K/uL  COMPREHENSIVE METABOLIC PANEL     Status: Abnormal   Collection Time    12/03/12  2:49 AM      Result Value Range   Sodium 132 (*) 135 - 145 mEq/L   Potassium 3.7  3.5 - 5.1 mEq/L   Chloride 97  96 - 112 mEq/L   CO2 20  19 - 32 mEq/L   Glucose, Bld 111 (*) 70 - 99 mg/dL   BUN 9  6 - 23 mg/dL   Creatinine, Ser 7.82  0.50 - 1.10 mg/dL   Calcium 8.4  8.4 - 95.6 mg/dL   Total Protein 7.6  6.0 - 8.3 g/dL   Albumin 3.7  3.5 - 5.2 g/dL   AST 20  0 - 37 U/L   ALT 12  0 - 35 U/L   Alkaline Phosphatase 59  39 - 117 U/L   Total Bilirubin 0.4  0.3 - 1.2 mg/dL   GFR calc non Af Amer >90  >90 mL/min   GFR calc Af Amer >90  >90 mL/min   Comment:            The eGFR has been calculated     using the CKD EPI equation.     This calculation has not been     validated in all clinical     situations.     eGFR's persistently     <90 mL/min signify      possible Chronic Kidney Disease.  URINALYSIS, ROUTINE W REFLEX MICROSCOPIC     Status: Abnormal   Collection Time    12/03/12  3:14 AM      Result Value Range   Color, Urine YELLOW  YELLOW   APPearance CLEAR  CLEAR   Specific Gravity, Urine 1.027  1.005 - 1.030   pH 5.5  5.0 - 8.0   Glucose, UA NEGATIVE  NEGATIVE mg/dL   Hgb urine dipstick NEGATIVE  NEGATIVE   Bilirubin Urine NEGATIVE  NEGATIVE   Ketones, ur >80 (*) NEGATIVE mg/dL   Protein, ur NEGATIVE  NEGATIVE mg/dL   Urobilinogen, UA 0.2  0.0 - 1.0 mg/dL   Nitrite NEGATIVE  NEGATIVE   Leukocytes, UA NEGATIVE  NEGATIVE   Comment: MICROSCOPIC NOT DONE ON URINES WITH NEGATIVE PROTEIN, BLOOD, LEUKOCYTES, NITRITE, OR GLUCOSE <1000 mg/dL.    Imaging / Studies: Ct Abdomen Pelvis W Contrast  12/03/2012  *RADIOLOGY REPORT*  Clinical Data: Epigastric pain  CT ABDOMEN AND PELVIS WITH CONTRAST  Technique:  Multidetector CT imaging of the abdomen and pelvis was performed  following the standard protocol during bolus administration of intravenous contrast.  Contrast: OMNIPAQUE IOHEXOL 300 MG/ML  SOLN  Comparison: 10/30/2007  Findings: Lung bases are clear.  Unremarkable liver, biliary system, spleen, pancreas, adrenal glands.  Symmetric renal enhancement.  No hydronephrosis or hydroureter.  No CT evidence for colitis. Limited colonic evaluation due to decompressed state.  The appendix is distended up to 1 cm.  An appendicolith is present.  Mild periappendiceal fat stranding about the tip.  An intussusception of small bowel is noted in the left hemiabdomen (series 2 image 35), likely transient/incidental. No bowel obstruction.  No free intraperitoneal air.  No lymphadenopathy.  Normal caliber aorta and branch vessels.  Bladder within normal limits.  Trace free fluid within the pelvis.  Retroverted uterus with suggestion of intramural fibroids.  No acute osseous finding.  IMPRESSION: Distended appendix up to 1 cm with mild periappendiceal fat  stranding.  There is an appendicolith at the base.  These findings are suspicious for acute appendicitis.   Original Report Authenticated By: Jearld Lesch, M.D.     Medications / Allergies: per chart  Antibiotics: Anti-infectives   Start     Dose/Rate Route Frequency Ordered Stop   12/03/12 0600  cefOXitin (MEFOXIN) 2 g in dextrose 5 % 50 mL IVPB     2 g 100 mL/hr over 30 Minutes Intravenous On call to O.R. 12/03/12 0529 12/04/12 0559      Assessment  Kathryn Nunez  42 y.o. female     Procedure(s): APPENDECTOMY LAPAROSCOPIC  Problem List:  Active Problems:   * No active hospital problems. *   History physical and CT scan suspicious for acute appendicitis  Plan:  -admit -IVF -IV ABX -Dx lap w appendectomy:  The anatomy & physiology of the digestive tract was discussed.  The pathophysiology of appendicitis was discussed.  Natural history risks without surgery was discussed.   I feel the risks of no intervention will lead to serious problems that outweigh the operative risks; therefore, I recommended diagnostic laparoscopy with removal of appendix to remove the pathology.  Laparoscopic & open techniques were discussed.   I noted a good likelihood this will help address the problem.    Risks such as bleeding, infection, abscess, leak, reoperation, possible ostomy, hernia, heart attack, death, and other risks were discussed.  Goals of post-operative recovery were discussed as well.  We will work to minimize complications.  Questions were answered.  The patient expresses understanding & wishes to proceed with surgery.  -Try to obtain GI records from [facility] gastroenterology -VTE prophylaxis- SCDs, etc -mobilize as tolerated to help recovery    Ardeth Sportsman, M.D., F.A.C.S. Gastrointestinal and Minimally Invasive Surgery [facility] [address], [address] [place], [place] [address] [phone] Main / Paging [phone] Voice  Mail   12/03/2012  CARE TEAM:  PCP: No primary provider on file.  Outpatient Care Team: Patient has no care team.  Inpatient Treatment Team: Treatment Team: Attending Provider: Olivia Mackie, MD; Registered Nurse: Liborio Nixon, RN; Technician: Areta Haber, [place]; Consulting Physician: Bishop Limbo, MD

## 2012-12-04 ENCOUNTER — Encounter (HOSPITAL_COMMUNITY): Payer: Self-pay | Admitting: Surgery

## 2012-12-04 LAB — CBC
HCT: 27.9 % — ABNORMAL LOW (ref 36.0–46.0)
MCV: 85.1 fL (ref 78.0–100.0)
RDW: 14.9 % (ref 11.5–15.5)
WBC: 6.9 10*3/uL (ref 4.0–10.5)

## 2012-12-04 LAB — HEMOGLOBIN AND HEMATOCRIT, BLOOD
HCT: 32.2 % — ABNORMAL LOW (ref 36.0–46.0)
Hemoglobin: 10.2 g/dL — ABNORMAL LOW (ref 12.0–15.0)

## 2012-12-04 NOTE — Progress Notes (Signed)
Pt was stable at time of discharge. Pain med was administered. IV was removed. Prescriptions and discharge packet were reviewed and given to pt. Pt did not have further questions after discharge education. Pt's belongings from safe were returned to her before leaving.

## 2012-12-04 NOTE — Discharge Summary (Signed)
  Physician Discharge Summary  Patient ID: Kathryn Nunez MRN: [id number] DOB/AGE: [birth year] 40 y.o.  Admit date: 12/03/2012 Discharge date: 12/04/2012  Admitting Diagnosis: Acute Appendicitis  Discharge Diagnosis Acute Appendicitis  Consultants None  Procedures Laparoscopic Appendectomy  Hospital Course 42 yr old female who presented to [facility] with abdominal pain.  Workup showed appendicitis.  Patient was admitted and underwent procedure listed above.  Tolerated procedure well and was transferred to the floor.  Diet was advanced as tolerated.  The patient had some hypotension post-op but was asymptomatic of this.  On POD#1, the patient was voiding well, tolerating diet, ambulating well, pain well controlled, vital signs stable, incisions c/d/i and felt stable for discharge home.  Patient will follow up in our office in 2 weeks and knows to call with questions or concerns.  Physical Exam on Discharge General: awake, alert, NAD, eating regular breakfast Abd: soft, min tenderness, dressings dry, +BS Heart: RRR Lungs: CTA bil    Medication List    TAKE these medications       acetaminophen 325 MG tablet  Commonly known as:  TYLENOL  Take 2 tablets (650 mg total) by mouth every 6 (six) hours as needed for pain.     amoxicillin-clavulanate 875-125 MG per tablet  Commonly known as:  AUGMENTIN  Take 1 tablet by mouth 2 (two) times daily.     GREEN TEA PO  Take 2 capsules by mouth.     oxyCODONE 5 MG immediate release tablet  Commonly known as:  Oxy IR/ROXICODONE  Take 1-2 tablets (5-10 mg total) by mouth every 4 (four) hours as needed.             Follow-up Information   Follow up with [facility]. Schedule an appointment as soon as possible for a visit in 2 weeks. (IF this is full ask for follow up with Dr. Michaell Cowing.)    Contact information:   [address]   [place] [address] [phone]       Signed: Denny Levy [facility]  Surgery [phone]  12/04/2012, 8:02 AM

## 2012-12-04 NOTE — Discharge Summary (Signed)
I personally reviewed patient's record, examined the patient, and formulated the following assessment and plan:  Hgb stable.  Pt's vitals stable, asymptomatic.  Ok to d/c

## 2012-12-04 NOTE — Progress Notes (Signed)
When pt's BP was in the 80's this evening, I mistakenly paged [facility] who gave orders for a bolus. Just notified Dr. Daphine Deutscher on call for [facility] telling him this and telling him that pressures were still in the 80's. Pt mentating, oriented, ambulatory. CBC ordered for the morning. Will continue to monitor.

## 2012-12-04 NOTE — Progress Notes (Signed)
I personally reviewed patient's record, examined the patient, and formulated the following assessment and plan:  Doing well.  Hypotension overnight and elevated HR.  Hgb down 3g from preop.  Pt feels great.  Will recheck in pm.

## 2012-12-04 NOTE — Progress Notes (Signed)
Repeat H/H  10.2/32.2  this AM   9/27 12/35.9 yesterday  12/02/12 --2034 hours  10/31.4  BP sitting 105/59     Standing  111/68 HR sitting 112          Standing 110 Plan to send home now

## 2012-12-05 ENCOUNTER — Telehealth (INDEPENDENT_AMBULATORY_CARE_PROVIDER_SITE_OTHER): Payer: Self-pay | Admitting: General Surgery

## 2012-12-05 NOTE — Telephone Encounter (Signed)
Pt called to report blisters x2 near lower left incision, incision that is lower than umbilical incision. She said that one of blisters is approximately 2cm. Also her hands and feet are swollen, hands feel tight. She does not take any diuretics. Swelling is more this am. No nausea or fever reported and she has had bowel movement, loose in nature.I will review with Dr. Georgena Spurling

## 2012-12-05 NOTE — Telephone Encounter (Signed)
I reviewed pt's concerns with Dr. Michaell Cowing and he said to use ice pack over blisters and warm showers, ok to use neosporin on blister or pt can pop blister if she wishes. He said that swelling can occur 3-7 days after surgery and that it should gradually lessen, he instructed her to try walking 1 hour a day to improve circulation. I also told her to eat active culture yogurt 2-3 times a day since she is taking an antibiotic after surgery. She is aware of instructions and will call office if symptoms do not improve an can be seen in [facility] if necessary per Dr. Georgena Spurling

## 2012-12-10 ENCOUNTER — Encounter (INDEPENDENT_AMBULATORY_CARE_PROVIDER_SITE_OTHER): Payer: Self-pay

## 2012-12-12 ENCOUNTER — Telehealth (INDEPENDENT_AMBULATORY_CARE_PROVIDER_SITE_OTHER): Payer: Self-pay | Admitting: *Deleted

## 2012-12-12 ENCOUNTER — Encounter (INDEPENDENT_AMBULATORY_CARE_PROVIDER_SITE_OTHER): Payer: Self-pay | Admitting: *Deleted

## 2012-12-12 NOTE — Telephone Encounter (Signed)
Patient called to ask for a return to work note for 12/16/12.  Note was left at the front desk for patient to pick up.

## 2012-12-25 ENCOUNTER — Encounter (INDEPENDENT_AMBULATORY_CARE_PROVIDER_SITE_OTHER): Payer: BC Managed Care – PPO | Admitting: Surgery

## 2012-12-31 ENCOUNTER — Encounter (INDEPENDENT_AMBULATORY_CARE_PROVIDER_SITE_OTHER): Payer: BC Managed Care – PPO | Admitting: Surgery

## 2013-02-03 ENCOUNTER — Encounter (INDEPENDENT_AMBULATORY_CARE_PROVIDER_SITE_OTHER): Payer: BC Managed Care – PPO | Admitting: Surgery

## 2013-02-27 ENCOUNTER — Encounter (INDEPENDENT_AMBULATORY_CARE_PROVIDER_SITE_OTHER): Payer: Self-pay | Admitting: Surgery

## 2013-07-02 IMAGING — CT CT ABD-PELV W/ CM
1 of 2 series · 15 of 32 positions shown, 19 images · IV contrast (100 ML OMNI 300)
Comparison: 10/30/2007

CLINICAL DATA: Epigastric pain

CT ABDOMEN AND PELVIS WITH CONTRAST
TECHNIQUE: Multidetector CT imaging of the abdomen and pelvis was
performed following the standard protocol during bolus
administration of intravenous contrast.
Contrast: 100mL OMNIPAQUE IOHEXOL 300 MG/ML  SOLN

[Series 2: abd/pel with · axial · 0.59mm/px · z∈[+1146,+1511]mm · 15 of 82 slices shown, 19 images]
[im 6/82  soft-tissue]
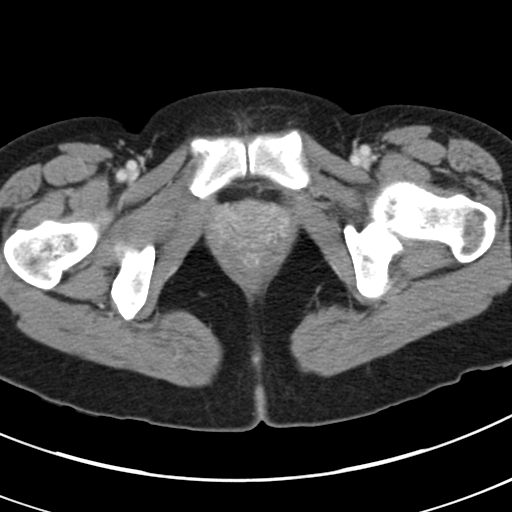
[im 6/82  bone]
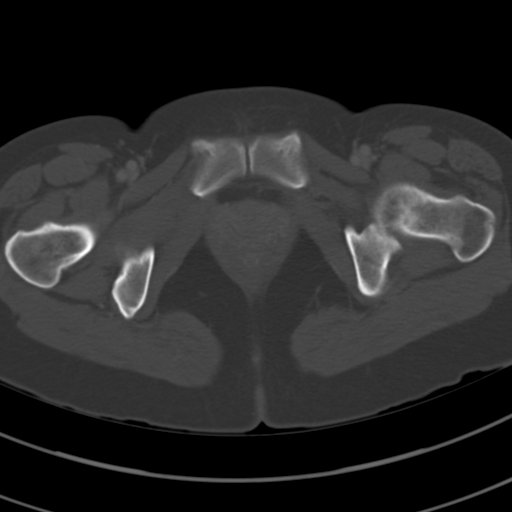
[im 12/82  soft-tissue]
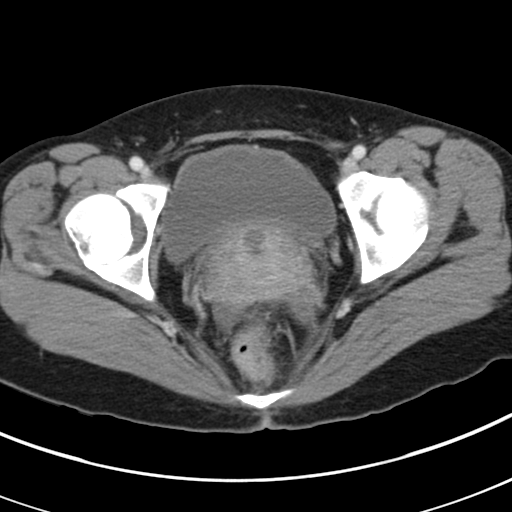
[im 18/82  soft-tissue]
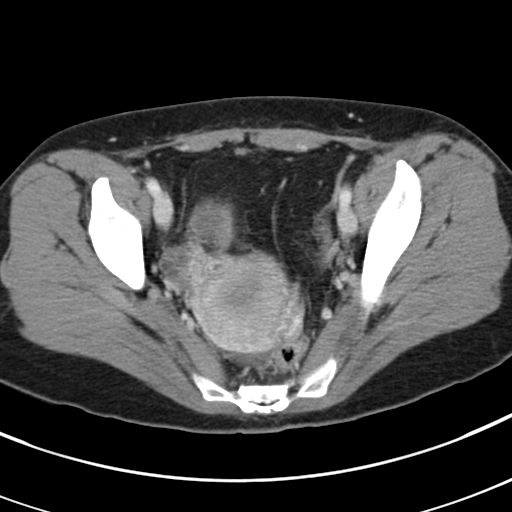
[im 24/82  soft-tissue]
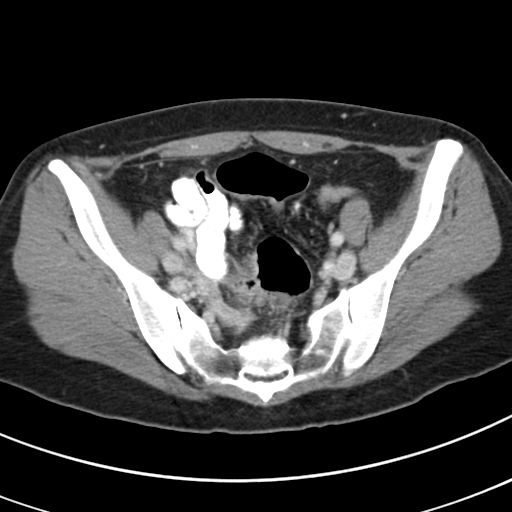
[im 29/82  soft-tissue]
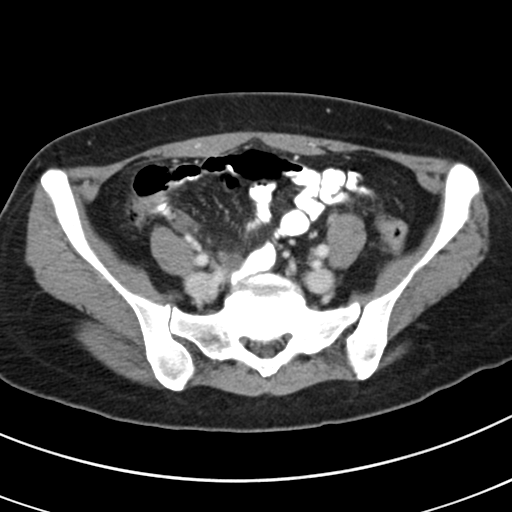
[im 35/82  soft-tissue]
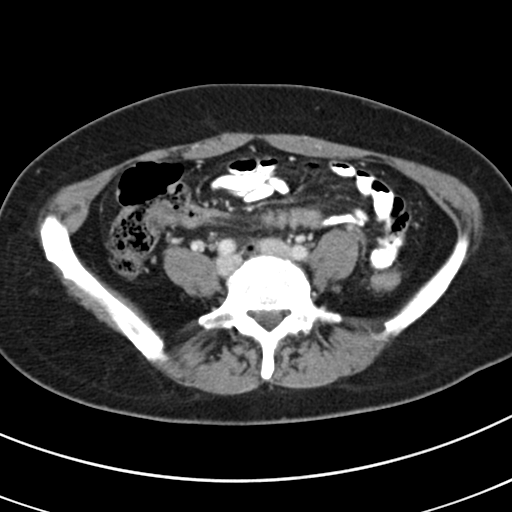
[im 41/82  soft-tissue]
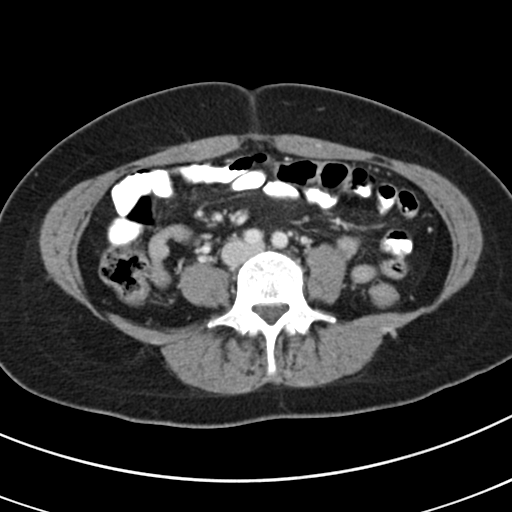
[im 47/82  soft-tissue]
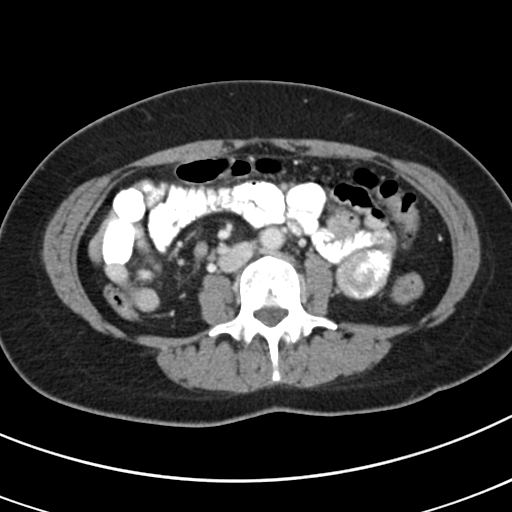
[im 53/82  soft-tissue]
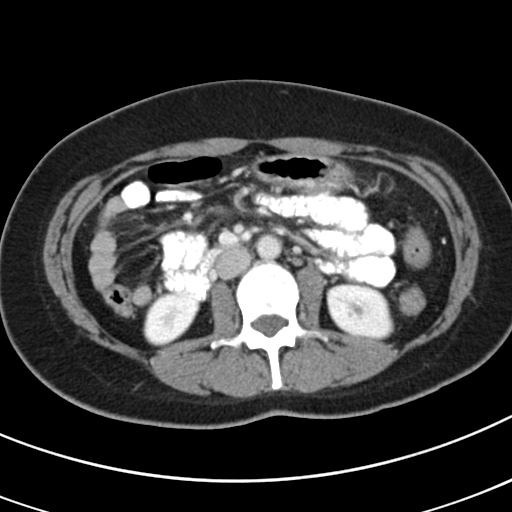
[im 53/82  bone]
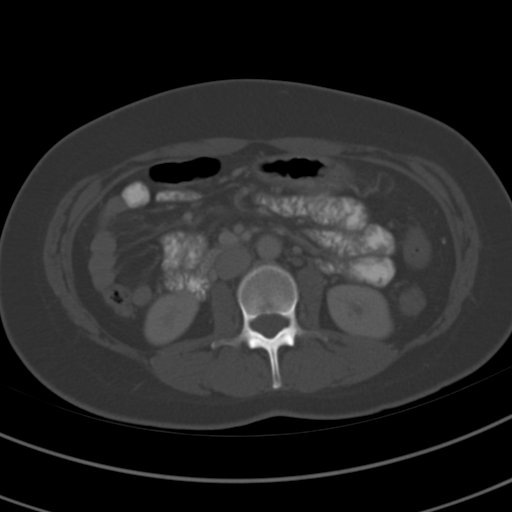
[im 58/82  soft-tissue]
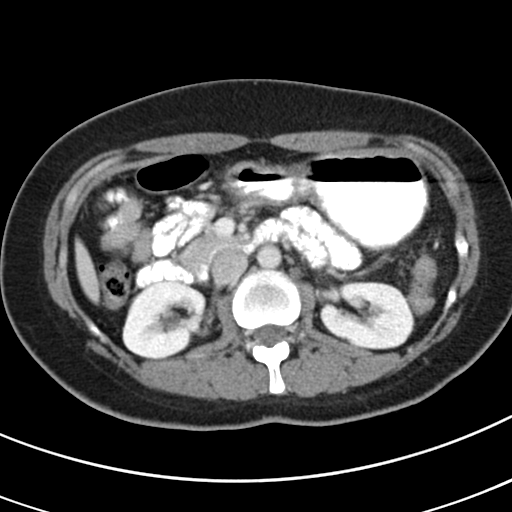
[im 64/82  soft-tissue]
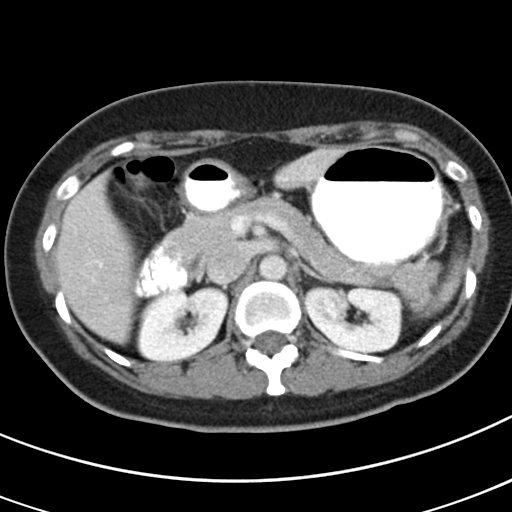
[im 70/82  soft-tissue]
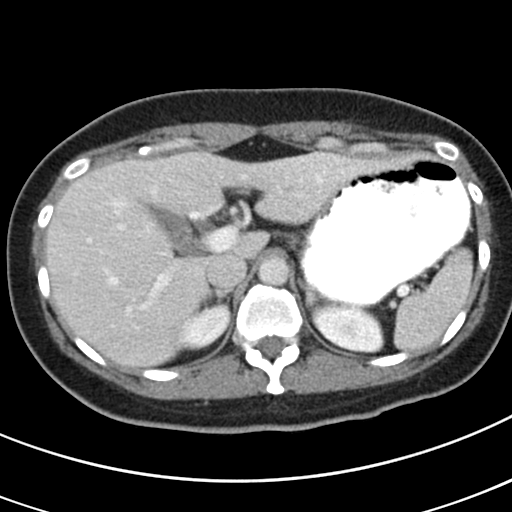
[im 70/82  lung]
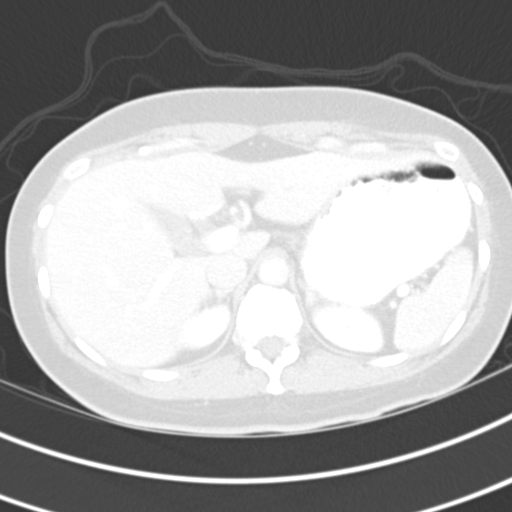
[im 73/82  lung]
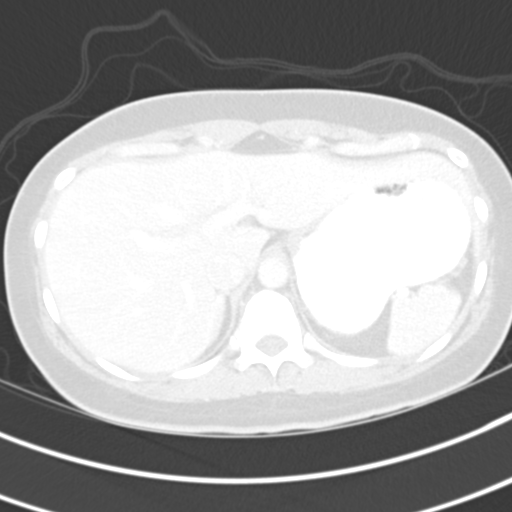
[im 76/82  soft-tissue]
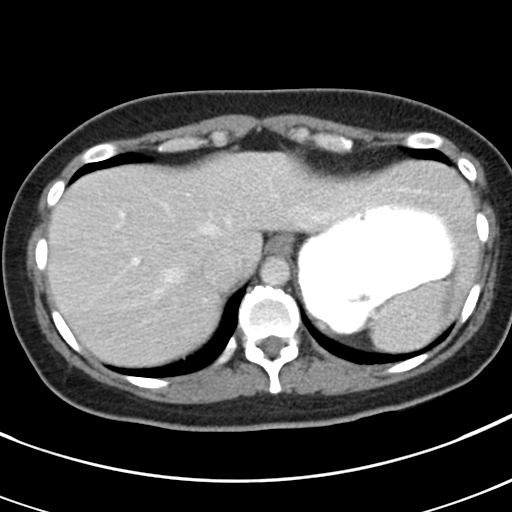
[im 76/82  lung]
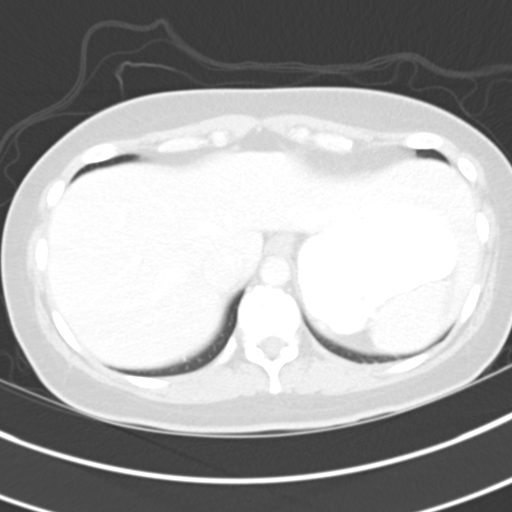
[im 79/82  lung]
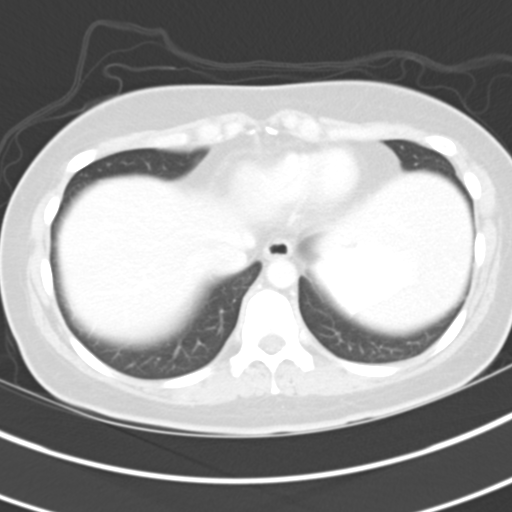

[15 of 32 positions shown; findings below may reference images not displayed]

FINDINGS: Lung bases are clear.

Unremarkable liver, biliary system, spleen, pancreas, adrenal
glands.

Symmetric renal enhancement.  No hydronephrosis or hydroureter.

No CT evidence for colitis. Limited colonic evaluation due to
decompressed state.  The appendix is distended up to 1 cm.  An
appendicolith is present.  Mild periappendiceal fat stranding about
the tip.

An intussusception of small bowel is noted in the left hemiabdomen
(series 2 image 35), likely transient/incidental. No bowel
obstruction.

No free intraperitoneal air.  No lymphadenopathy.

Normal caliber aorta and branch vessels.  Bladder within normal
limits.  Trace free fluid within the pelvis.  Retroverted uterus
with suggestion of intramural fibroids.

No acute osseous finding.
IMPRESSION: Distended appendix up to 1 cm with mild periappendiceal fat
stranding.  There is an appendicolith at the base.  These findings
are suspicious for acute appendicitis.

## 2014-10-29 ENCOUNTER — Encounter (HOSPITAL_COMMUNITY): Payer: Self-pay | Admitting: Surgery

## 2017-06-01 ENCOUNTER — Telehealth: Payer: Self-pay | Admitting: Cardiovascular Disease

## 2017-06-01 NOTE — Telephone Encounter (Signed)
Recieved incoming records from [facility] for upcoming appointment on 06/19/17 @ 8:40am with Dr. Tresa Endo. Records given to [facility] in Medial Records. 06/01/17/ab

## 2017-06-19 ENCOUNTER — Ambulatory Visit: Payer: Self-pay | Admitting: Cardiovascular Disease

## 2017-08-08 ENCOUNTER — Telehealth: Payer: Self-pay | Admitting: Cardiovascular Disease

## 2017-08-08 NOTE — Telephone Encounter (Signed)
Patient was scheduled to see MD as new patient on 06/19/17 and cancelled appt and did not r/s. [facility] as patient was referred to Dr. Tresa EndoKelly by her work MD. Informed her there are no records, as patient cancelled.

## 2017-08-08 NOTE — Telephone Encounter (Signed)
New Message   [email or web address] calling to get summary of patient's appointment with Dr. Tresa EndoKelly on 06/19/17. FAX:[phone]
